# Patient Record
Sex: Male | Born: 1985 | Race: Black or African American | Hispanic: No | Marital: Single | Smoking: Former smoker
Health system: Southern US, Community
[De-identification: ages and names within clinical notes are randomized; demographics above are authoritative.]

---

## 2004-07-19 ENCOUNTER — Emergency Department (HOSPITAL_COMMUNITY): Admission: EM | Admit: 2004-07-19 | Discharge: 2004-07-19 | Payer: Self-pay | Admitting: Emergency Medicine

## 2004-12-25 ENCOUNTER — Emergency Department (HOSPITAL_COMMUNITY): Admission: EM | Admit: 2004-12-25 | Discharge: 2004-12-25 | Payer: Self-pay | Admitting: Emergency Medicine

## 2004-12-25 ENCOUNTER — Ambulatory Visit: Payer: Self-pay | Admitting: Orthopedic Surgery

## 2004-12-28 ENCOUNTER — Ambulatory Visit: Payer: Self-pay | Admitting: Orthopedic Surgery

## 2009-06-08 ENCOUNTER — Emergency Department (HOSPITAL_COMMUNITY): Admission: EM | Admit: 2009-06-08 | Discharge: 2009-06-08 | Payer: Self-pay | Admitting: Emergency Medicine

## 2009-06-10 ENCOUNTER — Emergency Department (HOSPITAL_COMMUNITY): Admission: EM | Admit: 2009-06-10 | Discharge: 2009-06-10 | Payer: Self-pay | Admitting: Pediatrics

## 2009-08-22 ENCOUNTER — Emergency Department (HOSPITAL_COMMUNITY): Admission: EM | Admit: 2009-08-22 | Discharge: 2009-08-22 | Payer: Self-pay | Admitting: Emergency Medicine

## 2009-09-28 ENCOUNTER — Emergency Department (HOSPITAL_COMMUNITY): Admission: EM | Admit: 2009-09-28 | Discharge: 2009-09-28 | Payer: Self-pay | Admitting: Emergency Medicine

## 2011-04-05 LAB — CULTURE, ROUTINE-ABSCESS
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
## 2011-05-14 NOTE — Op Note (Signed)
NAME:  Javier Moss, Javier Moss            ACCOUNT NO.:  [id number]   MEDICAL RECORD NO.:  [id number]          PATIENT TYPE:  EMS   LOCATION:  ED                            FACILITY:  [facility]   PHYSICIAN:  Karol T. Lazarus Salines, M.D. DATE OF BIRTH:  [birth year]   DATE OF PROCEDURE:  DATE OF DISCHARGE:  07/19/2004                                 OPERATIVE REPORT   PREOPERATIVE DIAGNOSIS:  Recurrent laryngeal papillomatosis.   POSTOPERATIVE DIAGNOSIS:  Recurrent laryngeal papillomatosis.   OPERATION PERFORMED:  Microdirect laryngoscopy and mechanical debridement,  vocal cord papillomatosis.   SURGEON:  Gloris Manchester. Lazarus Salines, M.D.   ANESTHESIA:  General orotracheal anesthesia.   ESTIMATED BLOOD LOSS:  Minimal.   COMPLICATIONS:  None.   FINDINGS:  Relatively bulky pedunculated papillomatous disease on the  superior and inferior surfaces of both vocal cords wrapped around the  anterior commissure in a horseshoe fashion with the left vocal cord slightly  more involved than the right.  No significant extension supraglottic or  subglottic.   DESCRIPTION OF PROCEDURE:  With the patient in a comfortable supine  position, general orotracheal anesthesia was induced without difficulty.  At  an appropriate level, the table was turned 90 degrees and the patient placed  in reverse Trendelenburg.  A clean preparation and draping was accomplished.  The rubber tooth guard was placed.  Taking care to protect lips, teeth and  endotracheal tube, the laser laryngoscope was introduced, poised in the  glottis and suspended in the standard fashion.  The findings were as  described above.  4% cocaine solution was applied on 1/2 x 1-1/2 inch  cottonoids to both sides.  Several minutes were allowed for intraoperative  hemostasis to take place.  The cottonoids were removed and photographs were  taken.  Using the laryngeal microdebrider tip, the bulky papillomatous  disease was harvested from both sides, working along the  vocal ligament.  A  tiny amount of disease was left in the anterior commissure to prevent web  formation, otherwise relatively complete mechanical debridement was  accomplished with minimal oozing.  The trachea was suctioned free of a small  amount of blood above the endotracheal tube balloon.  4% cocaine moistened  pledgets were placed against the raw surfaces once again for intraoperative  hemostasis.  Again, several minutes were allowed for this to take effect.  Pledgets were removed and hemostasis was observed.  Photograph was taken but  the orientation was poor and the photographs were discarded.  Before the  photographs were completed, the laryngoscope was unsuspended and removed  without difficulty suctioning free small amounts of residual blood on the  way out.  Rubber tooth guard was removed and the dental status was intact.  The patient was returned to anesthesia, awakened, extubated and transferred  to recovery in stable condition.   COMMENT:  Reportedly, the 11th episode  of microdirect laryngoscopy and  removal of laryngeal papillomatosis for this 25 year old black male.  Anticipate a routine postoperative recovery with attention to analgesia as  needed, vocal hygiene.  Given low anticipated risks of post anesthetic or  post surgical complications,  I feel an outpatient venue is appropriate.      Karo   KTW/MEDQ  D:  09/28/2004  T:  09/28/2004  Job:  [id number]

## 2016-10-18 ENCOUNTER — Encounter (HOSPITAL_COMMUNITY): Payer: Self-pay | Admitting: Emergency Medicine

## 2016-10-18 ENCOUNTER — Emergency Department (HOSPITAL_COMMUNITY)
Admission: EM | Admit: 2016-10-18 | Discharge: 2016-10-18 | Disposition: A | Discharge status: Home / Self Care | Payer: Self-pay | Attending: Emergency Medicine | Admitting: Emergency Medicine

## 2016-10-18 DIAGNOSIS — Z202 Contact with and (suspected) exposure to infections with a predominantly sexual mode of transmission: Secondary | ICD-10-CM | POA: Insufficient documentation

## 2016-10-18 MED ORDER — METRONIDAZOLE 500 MG PO TABS: 500.0000 mg | ORAL_TABLET | Freq: Two times a day (BID) | ORAL | 0 refills | Status: DC

## 2016-10-18 NOTE — Discharge Instructions (Signed)
Return if any problems.
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
## 2016-10-18 NOTE — ED Provider Notes (Signed)
[facility]-EMERGENCY DEPT Provider Note   CSN: [id number] Arrival date & time: 10/18/16  1545  By signing my name below, I, Soijett Blue, attest that this documentation has been prepared under the direction and in the presence of Langston MaskerKaren Adanely Reynoso, PA-C Electronically Signed: Soijett Blue, ED Scribe. 10/18/16. 4:54 PM.   History   Chief Complaint Chief Complaint  Patient presents with  . Exposure to STD    HPI Javier Moss is a 30 y.o. male who presents to the Emergency Department complaining of exposure to STD onset today. Pt notes that he was informed by a male partner that she was dx with trichomonas and that he would need to be evaluated. He states that he has not tried any medications for the relief of his symptoms. He denies penile pain, penile swelling, penile discharge, testicular pain, testicular swelling, dysuria, fever, chills, and any other symptoms.   The history is provided by the patient. No language interpreter was used.    History reviewed. No pertinent past medical history.  There are no active problems to display for this patient.   History reviewed. No pertinent surgical history.     Home Medications    Prior to Admission medications   Not on File    Family History History reviewed. No pertinent family history.  Social History Social History  Substance Use Topics  . Smoking status: Never Smoker  . Smokeless tobacco: Not on file  . Alcohol use No     Allergies   Review of patient's allergies indicates no known allergies.   Review of Systems Review of Systems  Constitutional: Negative for chills and fever.  Genitourinary: Negative for discharge, dysuria, penile pain, penile swelling, scrotal swelling and testicular pain.     Physical Exam Updated Vital Signs BP 145/87 (BP Location: Left Arm)   Pulse 93   Temp 98.6 F (37 C) (Oral)   Resp 16   Ht 6' (1.829 m)   Wt 166 lb (75.3 kg)   SpO2 100%   BMI 22.51 kg/m   Physical Exam    Constitutional: He is oriented to person, place, and time. He appears well-developed and well-nourished. No distress.  HENT:  Head: Normocephalic and atraumatic.  Eyes: EOM are normal.  Neck: Neck supple.  Cardiovascular: Normal rate and regular rhythm.  Exam reveals no gallop and no friction rub.   No murmur heard. Pulmonary/Chest: Effort normal and breath sounds normal. No respiratory distress. He has no wheezes. He has no rales.  Abdominal: He exhibits no distension.  Musculoskeletal: Normal range of motion.  Neurological: He is alert and oriented to person, place, and time.  Skin: Skin is warm and dry.  Psychiatric: He has a normal mood and affect. His behavior is normal.  Nursing note and vitals reviewed.    ED Treatments / Results  DIAGNOSTIC STUDIES: Oxygen Saturation is 100% on RA, nl by my interpretation.    COORDINATION OF CARE: 4:53 PM Discussed treatment plan with pt at bedside which includes flagyl Rx, UA, and pt agreed to plan.   Labs (all labs ordered are listed, but only abnormal results are displayed) Labs Reviewed - No data to display  Procedures Procedures (including critical care time)  Medications Ordered in ED Medications - No data to display   Initial Impression / Assessment and Plan / ED Course  I have reviewed the triage vital signs and the nursing notes.  Pertinent labs that were available during my care of the patient were reviewed by  me and considered in my medical decision making (see chart for details).  Clinical Course    No outpatient prescriptions have been marked as taking for the 10/18/16 encounter [facility] Encounter).    Final Clinical Impressions(s) / ED Diagnoses   Final diagnoses:  Exposure to STD    New Prescriptions New Prescriptions   METRONIDAZOLE (FLAGYL) 500 MG TABLET    Take 1 tablet (500 mg total) by mouth 2 (two) times daily.     Lonia Skinner Harrison, PA-C 10/18/16 1656    Canary Brim Tegeler, MD 10/20/16  1352

## 2016-10-19 LAB — GC/CHLAMYDIA PROBE AMP (~~LOC~~) NOT AT ARMC
Chlamydia: NEGATIVE
Neisseria Gonorrhea: NEGATIVE

## 2017-06-15 ENCOUNTER — Emergency Department (HOSPITAL_COMMUNITY): Payer: Self-pay

## 2017-06-15 ENCOUNTER — Emergency Department (HOSPITAL_COMMUNITY)
Admission: EM | Admit: 2017-06-15 | Discharge: 2017-06-15 | Disposition: A | Discharge status: Home / Self Care | Payer: Self-pay | Attending: Emergency Medicine | Admitting: Emergency Medicine

## 2017-06-15 ENCOUNTER — Encounter (HOSPITAL_COMMUNITY): Payer: Self-pay | Admitting: Cardiology

## 2017-06-15 DIAGNOSIS — Z79899 Other long term (current) drug therapy: Secondary | ICD-10-CM | POA: Insufficient documentation

## 2017-06-15 DIAGNOSIS — K625 Hemorrhage of anus and rectum: Secondary | ICD-10-CM | POA: Insufficient documentation

## 2017-06-15 DIAGNOSIS — K5900 Constipation, unspecified: Secondary | ICD-10-CM | POA: Insufficient documentation

## 2017-06-15 DIAGNOSIS — R030 Elevated blood-pressure reading, without diagnosis of hypertension: Secondary | ICD-10-CM | POA: Insufficient documentation

## 2017-06-15 LAB — CBC WITH DIFFERENTIAL/PLATELET
Basophils Absolute: 0 10*3/uL (ref 0.0–0.1)
Basophils Relative: 0 %
Eosinophils Absolute: 0.1 10*3/uL (ref 0.0–0.7)
Eosinophils Relative: 1 %
HCT: 45.6 % (ref 39.0–52.0)
Hemoglobin: 15.6 g/dL (ref 13.0–17.0)
LYMPHS ABS: 2.1 10*3/uL (ref 0.7–4.0)
LYMPHS PCT: 37 %
MCH: 28.8 pg (ref 26.0–34.0)
MCHC: 34.2 g/dL (ref 30.0–36.0)
MCV: 84.3 fL (ref 78.0–100.0)
Monocytes Absolute: 0.4 10*3/uL (ref 0.1–1.0)
Monocytes Relative: 7 %
NEUTROS ABS: 3.1 10*3/uL (ref 1.7–7.7)
Neutrophils Relative %: 55 %
Platelets: 127 10*3/uL — ABNORMAL LOW (ref 150–400)
RBC: 5.41 MIL/uL (ref 4.22–5.81)
RDW: 13.4 % (ref 11.5–15.5)
WBC: 5.7 10*3/uL (ref 4.0–10.5)

## 2017-06-15 LAB — BASIC METABOLIC PANEL
Anion gap: 6 (ref 5–15)
BUN: 17 mg/dL (ref 6–20)
CO2: 30 mmol/L (ref 22–32)
Calcium: 9.3 mg/dL (ref 8.9–10.3)
Chloride: 101 mmol/L (ref 101–111)
Creatinine, Ser: 1.25 mg/dL — ABNORMAL HIGH (ref 0.61–1.24)
GFR calc Af Amer: >60 mL/min (ref >60)
GFR calc non Af Amer: >60 mL/min (ref >60)
GLUCOSE: 97 mg/dL (ref 65–99)
Potassium: 4.2 mmol/L (ref 3.5–5.1)
SODIUM: 137 mmol/L (ref 135–145)

## 2017-06-15 LAB — POC OCCULT BLOOD, ED: Fecal Occult Bld: NEGATIVE

## 2017-06-15 MED ORDER — HYDROCORTISONE ACETATE 25 MG RE SUPP: 25.0000 mg | Freq: Two times a day (BID) | RECTAL | 0 refills | Status: DC

## 2017-06-15 MED ORDER — IOPAMIDOL (ISOVUE-300) INJECTION 61%
100.0000 mL | Freq: Once | INTRAVENOUS | Status: AC | PRN
  Administered 2017-06-15: 100 mL via INTRAVENOUS

## 2017-06-15 NOTE — ED Triage Notes (Signed)
Noticing blood in stool times one month.

## 2017-06-15 NOTE — Discharge Instructions (Signed)
Your blood count is normal. No evidence for severe internal bleeding or infection. Your exam suggest a possible small internal hemorrhoid. Your CT scan is negative for mass or abscess. Please increase water and juices and gatorade. Increase leafy green veggies and bran in your diet. Use anusol morning and evening for the possible hemorrhoid and the bleeding. If bleeding continues, please see Dr. Karilyn Cotaehman for GI evaluation of the bleeding.

## 2017-06-15 NOTE — ED Provider Notes (Signed)
[facility] Provider Note   CSN: [id number] Arrival date & time: 06/15/17  0804     History   Chief Complaint Chief Complaint  Patient presents with  . Rectal Bleeding    HPI Javier Moss is a 31 y.o. male.  Pt reports problem with constipation. He reports bleeding from rectal area with just sitting on the commode. He has tried rectal suppositories, but the problem continues to be present. No excess ASA or ibuprofen use.   The history is provided by the patient.  Rectal Bleeding  Quality:  Unable to specify Amount:  Moderate Duration:  1 month Timing:  Intermittent Chronicity:  New Context: constipation and rectal pain   Context: not anal penetration and not hemorrhoids   Pain details:    Quality:  Aching and throbbing   Severity:  Moderate   Duration:  1 month Similar prior episodes: no   Relieved by:  Nothing Worsened by:  Defecation Ineffective treatments:  Hemorrhoid cream Associated symptoms: no abdominal pain, no dizziness, no epistaxis, no fever, no light-headedness, no loss of consciousness, no recent illness and no vomiting   Risk factors: no anticoagulant use, no hx of colorectal cancer, no hx of colorectal surgery and no NSAID use     History reviewed. No pertinent past medical history.  There are no active problems to display for this patient.   History reviewed. No pertinent surgical history.     Home Medications    Prior to Admission medications   Medication Sig Start Date End Date Taking? Authorizing Provider  metroNIDAZOLE (FLAGYL) 500 MG tablet Take 1 tablet (500 mg total) by mouth 2 (two) times daily. 10/18/16   Elson Areas, PA-C    Family History History reviewed. No pertinent family history.  Social History Social History  Substance Use Topics  . Smoking status: Never Smoker  . Smokeless tobacco: Never Used  . Alcohol use No     Allergies   Patient has no known allergies.   Review of Systems Review of  Systems  Constitutional: Negative for activity change, appetite change and fever.  HENT: Negative for congestion, ear discharge, ear pain, facial swelling, nosebleeds, rhinorrhea, sneezing and tinnitus.   Eyes: Negative for photophobia, pain and discharge.  Respiratory: Negative for cough, choking, shortness of breath and wheezing.   Cardiovascular: Negative for chest pain, palpitations and leg swelling.  Gastrointestinal: Positive for blood in stool, hematochezia and rectal pain. Negative for abdominal pain, constipation, diarrhea, nausea and vomiting.  Genitourinary: Negative for difficulty urinating, dysuria, flank pain, frequency and hematuria.  Musculoskeletal: Negative for back pain, gait problem, myalgias and neck pain.  Skin: Negative for color change, rash and wound.  Neurological: Negative for dizziness, seizures, loss of consciousness, syncope, facial asymmetry, speech difficulty, weakness, light-headedness and numbness.  Hematological: Negative for adenopathy. Does not bruise/bleed easily.  Psychiatric/Behavioral: Negative for agitation, confusion, hallucinations, self-injury and suicidal ideas. The patient is not nervous/anxious.      Physical Exam Updated Vital Signs BP (!) 138/109 (BP Location: Right Arm)   Pulse 93   Temp 98.3 F (36.8 C) (Oral)   Resp 16   Ht 5\' 9"  (1.753 m)   Wt 77.1 kg (170 lb)   SpO2 98%   BMI 25.10 kg/m   Physical Exam  Constitutional: Vital signs are normal. He appears well-developed and well-nourished. He is active.  HENT:  Head: Normocephalic and atraumatic.  Right Ear: Tympanic membrane, external ear and ear canal normal.  Left Ear: Tympanic membrane, external  ear and ear canal normal.  Nose: Nose normal.  Mouth/Throat: Uvula is midline, oropharynx is clear and moist and mucous membranes are normal.  Eyes: Conjunctivae, EOM and lids are normal. Pupils are equal, round, and reactive to light.  Neck: Trachea normal, normal range of motion  and phonation normal. Neck supple. Carotid bruit is not present.  Cardiovascular: Normal rate, regular rhythm and normal pulses.   Abdominal: Soft. Normal appearance and bowel sounds are normal. He exhibits no distension and no mass. There is no splenomegaly or hepatomegaly. There is no tenderness. There is no rigidity, no guarding and no CVA tenderness.  Genitourinary: Rectal exam shows tenderness. Rectal exam shows no mass and guaiac negative stool.  Genitourinary Comments: There is no external rash or fistula. No external hemorrhoid. Question of small internal hemorrhoid.  Lymphadenopathy:       Head (right side): No submental, no preauricular and no posterior auricular adenopathy present.       Head (left side): No submental, no preauricular and no posterior auricular adenopathy present.    He has no cervical adenopathy.  Neurological: He is alert. He has normal strength. No cranial nerve deficit or sensory deficit. GCS eye subscore is 4. GCS verbal subscore is 5. GCS motor subscore is 6.  Skin: Skin is warm and dry.  Psychiatric: His speech is normal.     ED Treatments / Results  Labs (all labs ordered are listed, but only abnormal results are displayed) Labs Reviewed - No data to display  EKG  EKG Interpretation None       Radiology No results found.  Procedures Procedures (including critical care time)  Medications Ordered in ED Medications - No data to display   Initial Impression / Assessment and Plan / ED Course  I have reviewed the triage vital signs and the nursing notes.  Pertinent labs & imaging results that were available during my care of the patient were reviewed by me and considered in my medical decision making (see chart for details).       Final Clinical Impressions(s) / ED Diagnoses MDM Blood pressure elevated, but otherwise v/s wnl. Complete blood count is well within normal limits. No orthostatic changes on pulse and blood pressure checks.  Basic metabolic panel is within normal limits with exception of the creatinine being elevated at 1.25. Stool for occult blood is negative. CT scan of the abdomen and pelvis is negative for any acute mass, abscess, or other acute abnormalities.   There is questionable small internal hemorrhoid noted on the rectal exam. Question if this combined with the problems with constipation may be the source of bleeding. I've asked the patient to increase fluids, increase bran, and to monitor his bowel movements on a closely. Given the patient the name of the gastroenterologist on call. The patient is to make an appointment if this bleeding continues.    Final diagnoses:  Rectal bleeding  Constipation, unspecified constipation type    New Prescriptions Discharge Medication List as of 06/15/2017 10:33 AM    START taking these medications   Details  hydrocortisone (ANUSOL-HC) 25 MG suppository Place 1 suppository (25 mg total) rectally 2 (two) times daily., Starting Wed 06/15/2017, Print         Ivery QualeBryant, Jabori Henegar, PA-C 06/16/17 [id number]    Pricilla LovelessGoldston, Scott, MD 06/20/17 [phone]

## 2017-12-01 IMAGING — CT CT ABD-PELV W/ CM
2 of 4 series · 16 of 46 positions shown, 18 images · IV contrast (Isovue)
Comparison: None.

CLINICAL DATA: Blood in stool x1 month, rectal bleeding

EXAM:
CT ABDOMEN AND PELVIS WITH CONTRAST
TECHNIQUE: Multidetector CT imaging of the abdomen and pelvis was performed
using the standard protocol following bolus administration of
intravenous contrast.
CONTRAST:  100mL Z1DCR5-B00 IOPAMIDOL (Z1DCR5-B00) INJECTION 61%

[Series 2: axial st · axial · 0.63mm/px · z∈[-521,-151]mm · 13 of 82 slices shown, 15 images]
[im 4/82  soft-tissue]
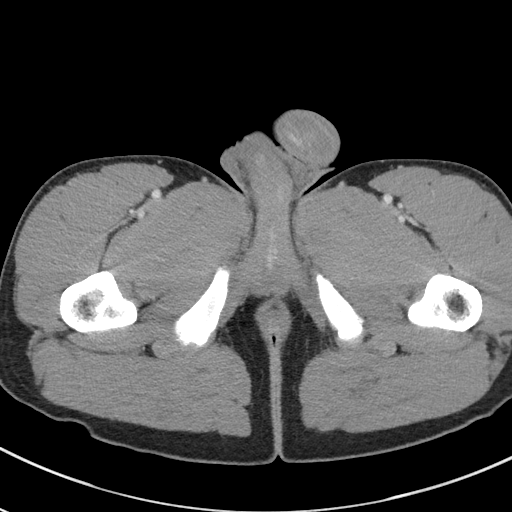
[im 4/82  bone]
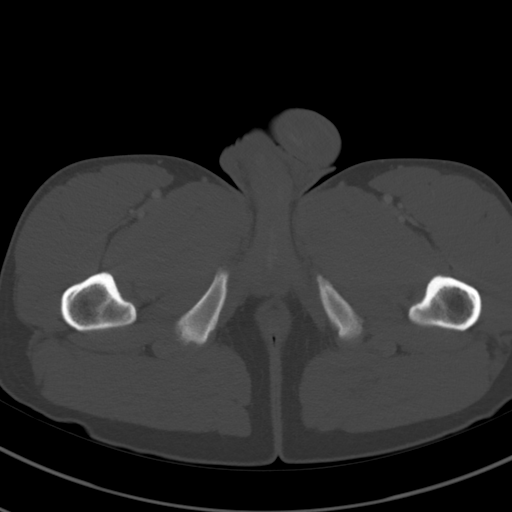
[im 10/82  soft-tissue]
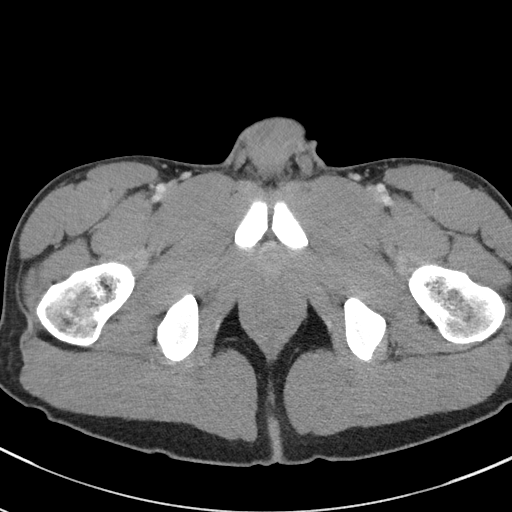
[im 17/82  soft-tissue]
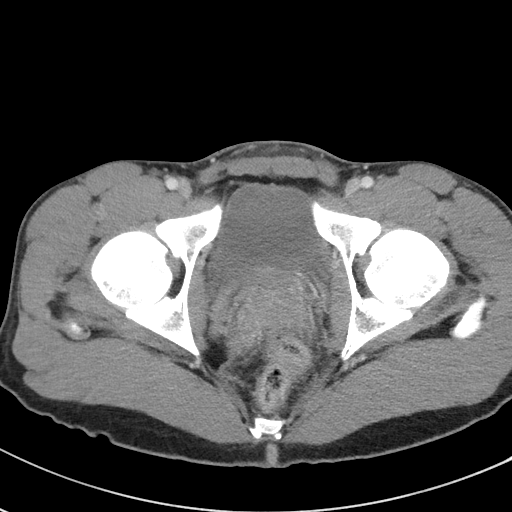
[im 23/82  soft-tissue]
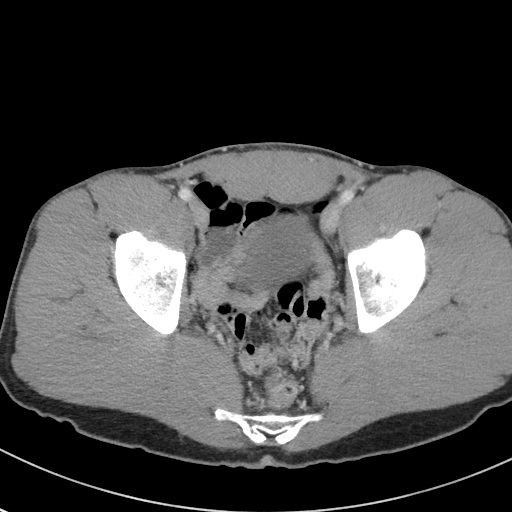
[im 30/82  soft-tissue]
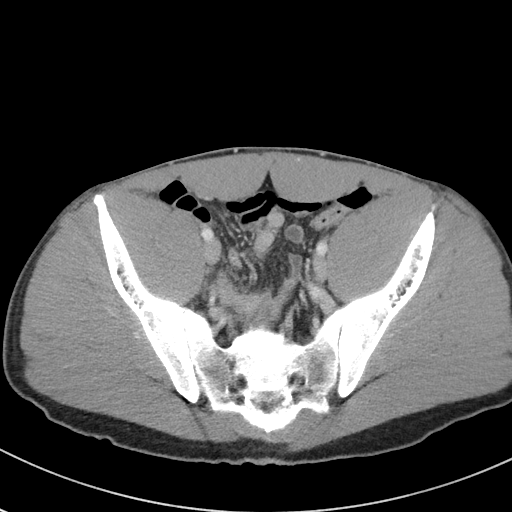
[im 36/82  soft-tissue]
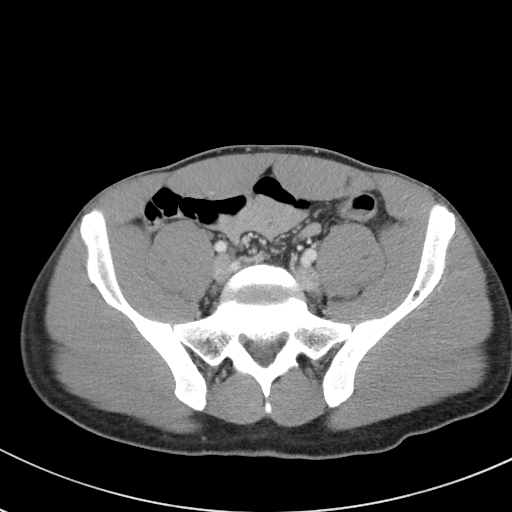
[im 43/82  soft-tissue]
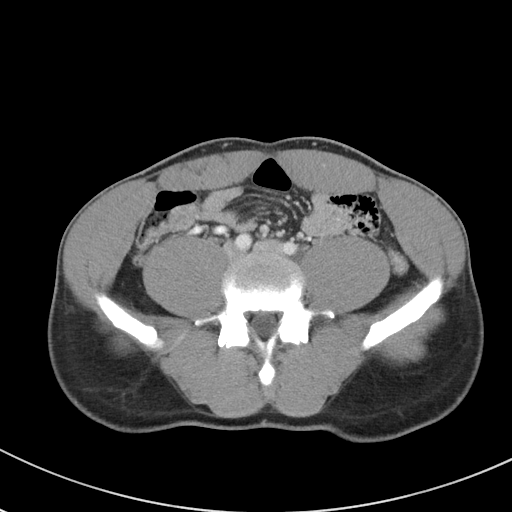
[im 46/82  soft-tissue]
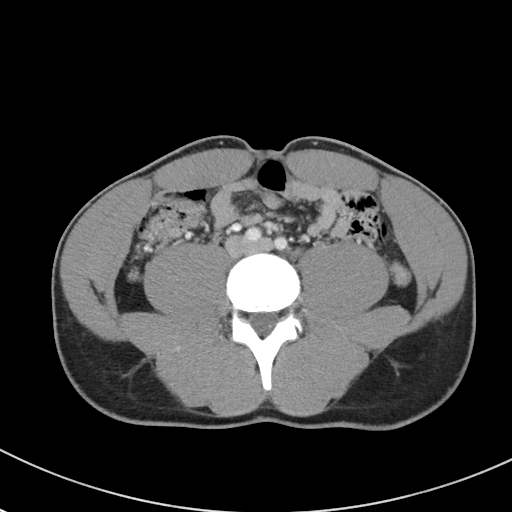
[im 52/82  soft-tissue]
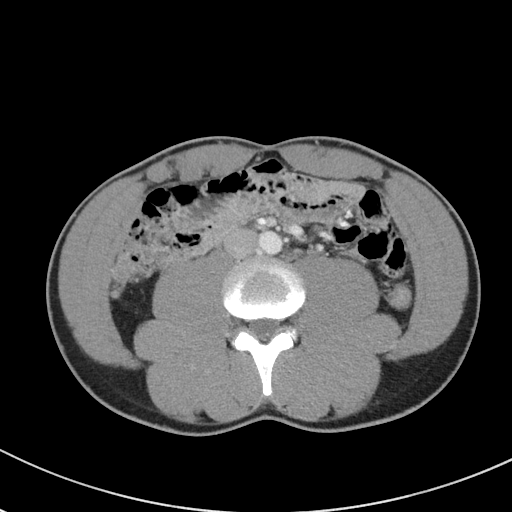
[im 52/82  bone]
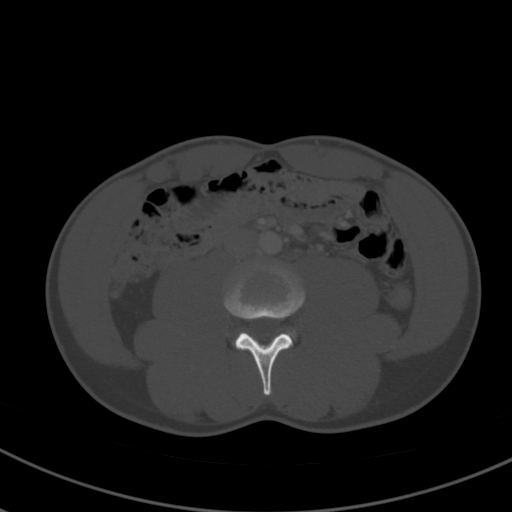
[im 59/82  soft-tissue]
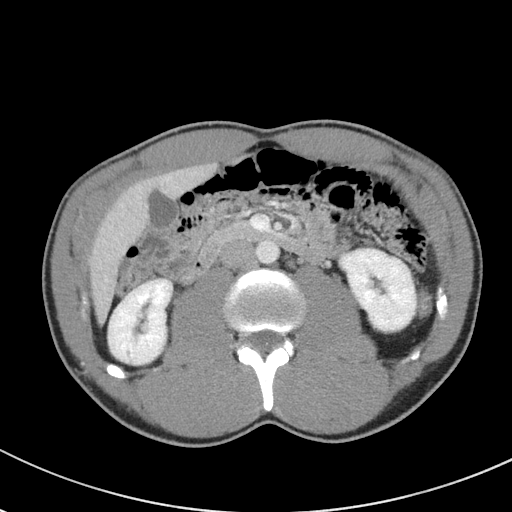
[im 65/82  soft-tissue]
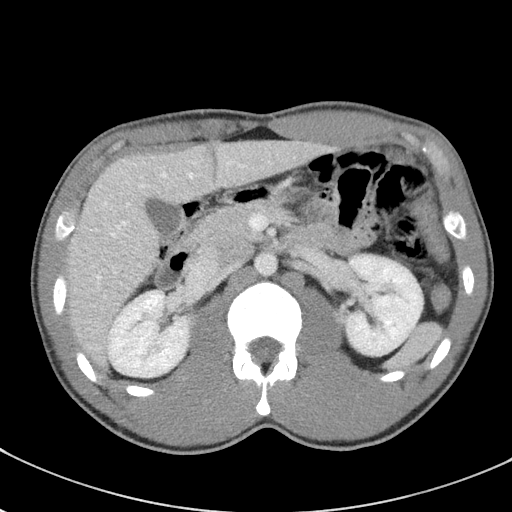
[im 72/82  soft-tissue]
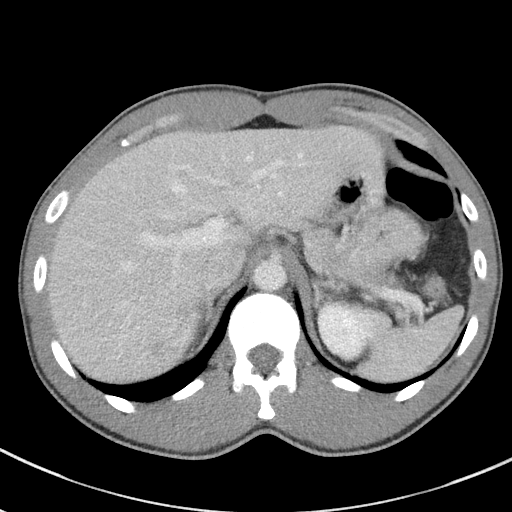
[im 78/82  soft-tissue]
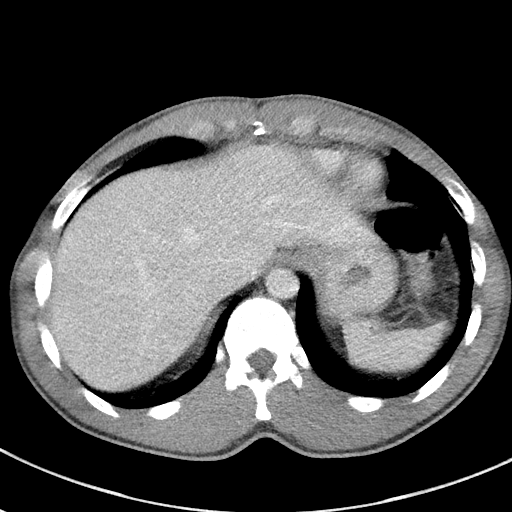

[Series 5: coronal st · coronal · 0.64mm/px · 3 of 79 slices shown]
[im 27/79  soft-tissue]
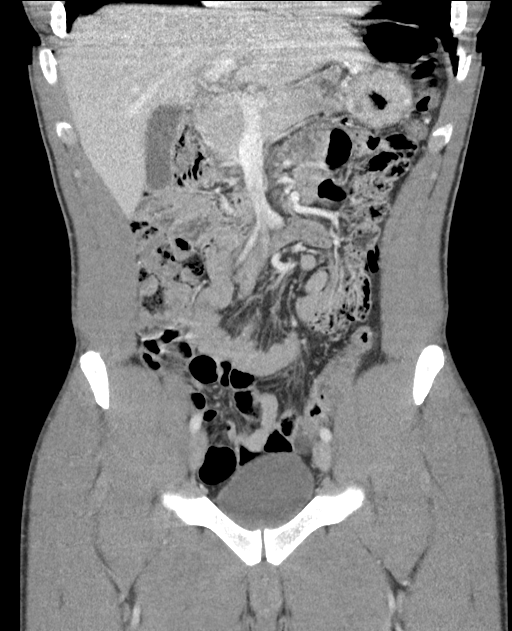
[im 35/79  soft-tissue]
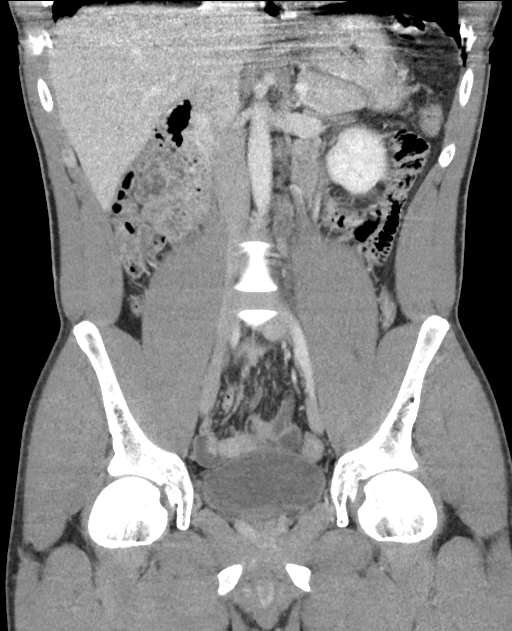
[im 44/79  soft-tissue]
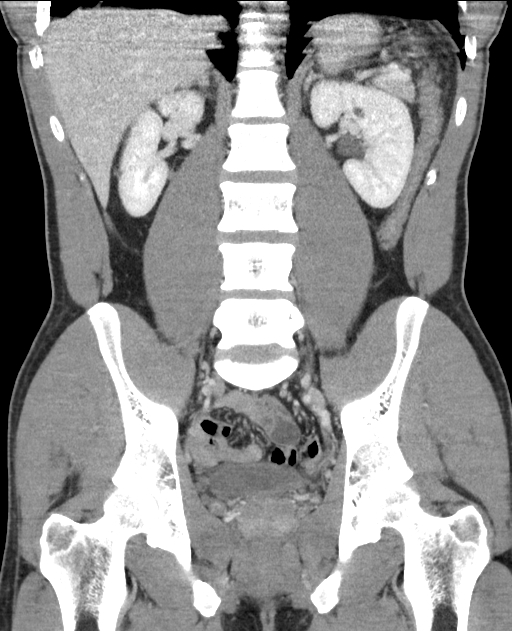

[16 of 46 positions shown; findings below may reference images not displayed]

FINDINGS: Motion degraded images.

Lower chest: Lung bases are clear.

Hepatobiliary: Liver is within normal limits.

Gallbladder is unremarkable. No intrahepatic or extrahepatic ductal
dilatation.

Pancreas: Within normal limits.

Spleen: Within normal limits.

Adrenals/Urinary Tract: Adrenal glands are within normal limits.

Kidneys within normal limits.  No hydronephrosis.

Bladder is within normal limits.

Stomach/Bowel: Stomach is within normal limits.

No evidence of bowel obstruction.

Normal appendix (series 2/ image 36).

Left colon is decompressed but unremarkable.

Vascular/Lymphatic: No evidence of abdominal aortic aneurysm.

No suspicious abdominopelvic lymphadenopathy.

Reproductive: Prostate is unremarkable.

Other: No abdominopelvic ascites.

Musculoskeletal: Visualized osseous structures are within normal
limits.
IMPRESSION: Motion degraded images.

Negative CT abdomen/pelvis.

## 2019-07-26 ENCOUNTER — Other Ambulatory Visit: Payer: Self-pay

## 2019-07-26 ENCOUNTER — Encounter (HOSPITAL_COMMUNITY): Payer: Self-pay | Admitting: Emergency Medicine

## 2019-07-26 ENCOUNTER — Emergency Department (HOSPITAL_COMMUNITY)
Admission: EM | Admit: 2019-07-26 | Discharge: 2019-07-26 | Disposition: A | Discharge status: Home / Self Care | Payer: Self-pay | Attending: Emergency Medicine | Admitting: Emergency Medicine

## 2019-07-26 DIAGNOSIS — Z87891 Personal history of nicotine dependence: Secondary | ICD-10-CM | POA: Insufficient documentation

## 2019-07-26 DIAGNOSIS — Z79899 Other long term (current) drug therapy: Secondary | ICD-10-CM | POA: Insufficient documentation

## 2019-07-26 DIAGNOSIS — M6282 Rhabdomyolysis: Secondary | ICD-10-CM | POA: Insufficient documentation

## 2019-07-26 DIAGNOSIS — E860 Dehydration: Secondary | ICD-10-CM | POA: Insufficient documentation

## 2019-07-26 LAB — URINALYSIS, ROUTINE W REFLEX MICROSCOPIC
Bilirubin Urine: NEGATIVE
Glucose, UA: NEGATIVE mg/dL
Hgb urine dipstick: NEGATIVE
Ketones, ur: 5 mg/dL — AB
Leukocytes,Ua: NEGATIVE
Nitrite: NEGATIVE
Protein, ur: 100 mg/dL — AB
Specific Gravity, Urine: 1.031 — ABNORMAL HIGH (ref 1.005–1.030)
pH: 5 (ref 5.0–8.0)

## 2019-07-26 LAB — COMPREHENSIVE METABOLIC PANEL
ALT: 22 U/L (ref 0–44)
AST: 30 U/L (ref 15–41)
Albumin: 5.5 g/dL — ABNORMAL HIGH (ref 3.5–5.0)
Alkaline Phosphatase: 82 U/L (ref 38–126)
Anion gap: 16 — ABNORMAL HIGH (ref 5–15)
BUN: 33 mg/dL — ABNORMAL HIGH (ref 6–20)
CO2: 20 mmol/L — ABNORMAL LOW (ref 22–32)
Calcium: 10.3 mg/dL (ref 8.9–10.3)
Chloride: 100 mmol/L (ref 98–111)
Creatinine, Ser: 2.33 mg/dL — ABNORMAL HIGH (ref 0.61–1.24)
GFR calc Af Amer: 41 mL/min — ABNORMAL LOW (ref >60)
GFR calc non Af Amer: 35 mL/min — ABNORMAL LOW (ref >60)
Glucose, Bld: 145 mg/dL — ABNORMAL HIGH (ref 70–99)
Potassium: 3.9 mmol/L (ref 3.5–5.1)
Sodium: 136 mmol/L (ref 135–145)
Total Bilirubin: 1 mg/dL (ref 0.3–1.2)
Total Protein: 9.7 g/dL — ABNORMAL HIGH (ref 6.5–8.1)

## 2019-07-26 LAB — BASIC METABOLIC PANEL
Anion gap: 9 (ref 5–15)
BUN: 31 mg/dL — ABNORMAL HIGH (ref 6–20)
CO2: 21 mmol/L — ABNORMAL LOW (ref 22–32)
Calcium: 7.9 mg/dL — ABNORMAL LOW (ref 8.9–10.3)
Chloride: 106 mmol/L (ref 98–111)
Creatinine, Ser: 1.8 mg/dL — ABNORMAL HIGH (ref 0.61–1.24)
GFR calc Af Amer: 56 mL/min — ABNORMAL LOW (ref >60)
GFR calc non Af Amer: 48 mL/min — ABNORMAL LOW (ref >60)
Glucose, Bld: 110 mg/dL — ABNORMAL HIGH (ref 70–99)
Potassium: 3.8 mmol/L (ref 3.5–5.1)
Sodium: 136 mmol/L (ref 135–145)

## 2019-07-26 LAB — CBC WITH DIFFERENTIAL/PLATELET
Abs Immature Granulocytes: 0.12 10*3/uL — ABNORMAL HIGH (ref 0.00–0.07)
Basophils Absolute: 0.1 10*3/uL (ref 0.0–0.1)
Basophils Relative: 1 %
Eosinophils Absolute: 0 10*3/uL (ref 0.0–0.5)
Eosinophils Relative: 0 %
HCT: 52.6 % — ABNORMAL HIGH (ref 39.0–52.0)
Hemoglobin: 17.8 g/dL — ABNORMAL HIGH (ref 13.0–17.0)
Immature Granulocytes: 1 %
Lymphocytes Relative: 9 %
Lymphs Abs: 1.4 10*3/uL (ref 0.7–4.0)
MCH: 27.6 pg (ref 26.0–34.0)
MCHC: 33.8 g/dL (ref 30.0–36.0)
MCV: 81.4 fL (ref 80.0–100.0)
Monocytes Absolute: 0.8 10*3/uL (ref 0.1–1.0)
Monocytes Relative: 5 %
Neutro Abs: 12.9 10*3/uL — ABNORMAL HIGH (ref 1.7–7.7)
Neutrophils Relative %: 84 %
Platelets: 167 10*3/uL (ref 150–400)
RBC: 6.46 MIL/uL — ABNORMAL HIGH (ref 4.22–5.81)
RDW: 13.3 % (ref 11.5–15.5)
WBC: 15.2 10*3/uL — ABNORMAL HIGH (ref 4.0–10.5)
nRBC: 0 % (ref 0.0–0.2)

## 2019-07-26 LAB — CK
Total CK: 844 U/L — ABNORMAL HIGH (ref 49–397)
Total CK: 912 U/L — ABNORMAL HIGH (ref 49–397)

## 2019-07-26 LAB — LIPASE, BLOOD: Lipase: 24 U/L (ref 11–51)

## 2019-07-26 MED ORDER — ONDANSETRON HCL 4 MG/2ML IJ SOLN
4.0000 mg | Freq: Once | INTRAMUSCULAR | Status: AC
  Administered 2019-07-26: 03:00:00 4 mg via INTRAVENOUS
  Filled 2019-07-26: qty 2

## 2019-07-26 MED ORDER — SODIUM CHLORIDE 0.9 % IV BOLUS
1000.0000 mL | Freq: Once | INTRAVENOUS | Status: AC
  Administered 2019-07-26: 1000 mL via INTRAVENOUS

## 2019-07-26 MED ORDER — ONDANSETRON 4 MG PO TBDP: 4.0000 mg | ORAL_TABLET | Freq: Three times a day (TID) | ORAL | 0 refills | Status: DC | PRN

## 2019-07-26 MED ORDER — SODIUM CHLORIDE 0.9 % IV BOLUS
1000.0000 mL | Freq: Once | INTRAVENOUS | Status: AC
Start: 2019-07-26 — End: 2019-07-26
  Administered 2019-07-26: 03:00:00 1000 mL via INTRAVENOUS

## 2019-07-26 MED ORDER — SODIUM CHLORIDE 0.9 % IV BOLUS
1000.0000 mL | Freq: Once | INTRAVENOUS | Status: AC
  Administered 2019-07-26: 03:00:00 1000 mL via INTRAVENOUS

## 2019-07-26 MED ORDER — KETOROLAC TROMETHAMINE 30 MG/ML IJ SOLN
30.0000 mg | Freq: Once | INTRAMUSCULAR | Status: AC
  Administered 2019-07-26: 30 mg via INTRAVENOUS
  Filled 2019-07-26: qty 1

## 2019-07-26 NOTE — ED Triage Notes (Signed)
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

## 2019-07-26 NOTE — ED Provider Notes (Signed)
[facility]ERGENCY DEPARTMENT Provider Note   [id number] [facility]: 07/[facility]/19  0044     History   Chief Complaint Chief Complaint  Patient presents with  . Emesis    HPI Leanord AsalDonald Moss is a 33 y.o. male.     Patient presents with generalized weakness, abdominal pain, nausea, vomiting and generalized weakness and decreased appetite.  Believes he is overheated working a new job at [employer].  He normally works 3 PM to 2 AM.  Started feeling bad today while at work around 5 PM.  He has been on this job only 3 days and quit today after feeling bad. Reports feeling nauseated and lightheaded with multiple associated vomiting is been nonbilious and nonbloody.  There was no diarrhea.  Has abdominal cramping as well as cramping in his arms and legs.  States he is not been able to keep anything down.  No diarrhea or fever.  No pain with urination or blood in the urine.  Denies any alcohol or drug use.  No other medical problems.  No headache or dizziness.  No chest pain or shortness of breath.  No focal weakness, numbness or tingling.  Denies any other medical problems or any other medication use.  The history is provided by the patient.  Emesis Associated symptoms: abdominal pain, arthralgias and myalgias   Associated symptoms: no cough, no diarrhea, no fever and no headaches     History reviewed. No pertinent past medical history.  There are no active problems to display for this patient.   History reviewed. No pertinent surgical history.      Home Medications    Prior to Admission medications   Medication Sig Start Date End Date Taking? Authorizing Provider  hydrocortisone (ANUSOL-HC) 25 MG suppository Place 1 suppository (25 mg total) rectally 2 (two) times daily. 06/15/17   Ivery QualeBryant, Hobson, PA-C  metroNIDAZOLE (FLAGYL) 500 MG tablet Take 1 tablet (500 mg total) by mouth 2 (two) times daily. Patient not taking: Reported on 06/15/2017 10/18/16   Osie CheeksSofia, Leslie K,  PA-C    Family History History reviewed. No pertinent family history.  Social History Social History   Tobacco Use  . Smoking status: Former Games developermoker  . Smokeless tobacco: Never Used  Substance Use Topics  . Alcohol use: No  . Drug use: Not Currently    Types: Marijuana    Comment: 2 days ago      Allergies   Patient has no known allergies.   Review of Systems Review of Systems  Constitutional: Positive for activity change and appetite change. Negative for fatigue and fever.  HENT: Negative for congestion and rhinorrhea.   Eyes: Negative for visual disturbance.  Respiratory: Negative for cough, chest tightness and shortness of breath.   Cardiovascular: Negative for chest pain and leg swelling.  Gastrointestinal: Positive for abdominal pain, nausea and vomiting. Negative for diarrhea.  Genitourinary: Negative for dysuria and hematuria.  Musculoskeletal: Positive for arthralgias and myalgias.  Skin: Negative for rash.  Neurological: Positive for weakness. Negative for dizziness, light-headedness and headaches.    all other systems are negative except as noted in the HPI and PMH.    Physical Exam Updated Vital Signs BP (!) 148/97 (BP Location: Right Arm)   Pulse 95   Temp 98.2 F (36.8 C) (Oral)   Resp 17   Ht 5\' 10"  (1.778 m)   Wt 81.6 kg   SpO2 100%   BMI 25.83 kg/m   Physical Exam Vitals signs and  nursing note reviewed.  Constitutional:      General: He is not in acute distress.    Appearance: He is well-developed.  HENT:     Head: Normocephalic and atraumatic.     Mouth/Throat:     Mouth: Mucous membranes are dry.     Pharynx: No oropharyngeal exudate.  Eyes:     Conjunctiva/sclera: Conjunctivae normal.     Pupils: Pupils are equal, round, and reactive to light.  Neck:     Musculoskeletal: Normal range of motion and neck supple.     Comments: No meningismus. Cardiovascular:     Rate and Rhythm: Normal rate and regular rhythm.     Heart sounds:  Normal heart sounds. No murmur.  Pulmonary:     Effort: Pulmonary effort is normal. No respiratory distress.     Breath sounds: Normal breath sounds.  Chest:     Chest wall: No tenderness.  Abdominal:     Palpations: Abdomen is soft.     Tenderness: There is no abdominal tenderness. There is no guarding or rebound.  Musculoskeletal: Normal range of motion.        General: No tenderness.     Comments: No clonus  Skin:    General: Skin is warm.     Capillary Refill: Capillary refill takes less than 2 seconds.  Neurological:     General: No focal deficit present.     Mental Status: He is alert and oriented to person, place, and time. Mental status is at baseline.     Cranial Nerves: No cranial nerve deficit.     Motor: No abnormal muscle tone.     Coordination: Coordination normal.     Comments: No ataxia on finger to nose bilaterally. No pronator drift. 5/5 strength throughout. CN 2-12 intact.Equal grip strength. Sensation intact.   Psychiatric:        Behavior: Behavior normal.      ED Treatments / Results  Labs (all labs ordered are listed, but only abnormal results are displayed) Labs Reviewed  CBC WITH DIFFERENTIAL/PLATELET - Abnormal; Notable for the following components:      Result Value   WBC 15.2 (*)    RBC 6.46 (*)    Hemoglobin 17.8 (*)    HCT 52.6 (*)    Neutro Abs 12.9 (*)    Abs Immature Granulocytes 0.12 (*)    All other components within normal limits  COMPREHENSIVE METABOLIC PANEL - Abnormal; Notable for the following components:   CO2 20 (*)    Glucose, Bld 145 (*)    BUN 33 (*)    Creatinine, Ser 2.33 (*)    Total Protein 9.7 (*)    Albumin 5.5 (*)    GFR calc non Af Amer 35 (*)    GFR calc Af Amer 41 (*)    Anion gap 16 (*)    All other components within normal limits  CK - Abnormal; Notable for the following components:   Total CK 844 (*)    All other components within normal limits  URINALYSIS, ROUTINE W REFLEX MICROSCOPIC - Abnormal; Notable  for the following components:   Color, Urine AMBER (*)    APPearance CLOUDY (*)    Specific Gravity, Urine 1.031 (*)    Ketones, ur 5 (*)    Protein, ur 100 (*)    Bacteria, UA RARE (*)    All other components within normal limits  CK - Abnormal; Notable for the following components:   Total CK 912 (*)  All other components within normal limits  BASIC METABOLIC PANEL - Abnormal; Notable for the following components:   CO2 21 (*)    Glucose, Bld 110 (*)    BUN 31 (*)    Creatinine, Ser 1.80 (*)    Calcium 7.9 (*)    GFR calc non Af Amer 48 (*)    GFR calc Af Amer 56 (*)    All other components within normal limits  LIPASE, BLOOD    EKG None  Radiology No results found.  Procedures Procedures (including critical care time)  Medications Ordered in ED Medications  sodium chloride 0.9 % bolus 1,000 mL (has no administration in time range)  ondansetron (ZOFRAN) injection 4 mg (has no administration in time range)  sodium chloride 0.9 % bolus 1,000 mL (has no administration in time range)     Initial Impression / Assessment and Plan / ED Course  I have reviewed the triage vital signs and the nursing notes.  Pertinent labs & imaging results that were available during my care of the patient were reviewed by me and considered in my medical decision making (see chart for details).       Patient with abdominal pain, nausea, vomiting, body aches and cramps after working in the heat today.  Vitals stable.  No distress.  Abdomen soft without peritoneal signs.  He will be hydrated.  Labs show hemoconcentration with AKI of 2.3.  CK 844.  Patient feeling better after receiving IV fluids.  His abdomen is soft and nontender and is tolerating p.o.  Patient feels much improved.  He is tolerating p.o. and cramps have resolved.  Abdomen is soft and nontender.  Creatinine has improved to 1.8.  CK 912.  Discussed diagnosis of rhabdomyolysis with the patient.  He is anxious to leave.   Discussed increasing his oral hydration at home and avoiding hot environments.  Advised follow-up with PCP for recheck of creatinine and CK later this week.  Return precautions discussed.  Final Clinical Impressions(s) / ED Diagnoses   Final diagnoses:  Dehydration  Non-traumatic rhabdomyolysis    ED Discharge Orders    None       Ariella Voit, Annie Main, MD 07/26/19 [phone]

## 2019-07-26 NOTE — Discharge Instructions (Signed)
Keep yourself hydrated.  Avoid hot environments.  As we discussed you should have your kidney function rechecked Friday or Monday.  Establish care with a primary doctor or go to urgent care to have this blood test done.  Come back to the ED if you not able to go anywhere else to have a blood test to check your kidney function.  Keep yourself hydrated at home and avoid hot environments, alcohol, caffeine.  Return to the ED if you develop new or worsening symptoms.

## 2020-05-06 ENCOUNTER — Ambulatory Visit: Payer: Self-pay | Attending: Internal Medicine

## 2020-05-06 ENCOUNTER — Other Ambulatory Visit: Payer: Self-pay

## 2020-05-06 DIAGNOSIS — Z20822 Contact with and (suspected) exposure to covid-19: Secondary | ICD-10-CM

## 2020-05-07 LAB — NOVEL CORONAVIRUS, NAA: SARS-CoV-2, NAA: NOT DETECTED

## 2020-05-07 LAB — SARS-COV-2, NAA 2 DAY TAT

## 2020-08-13 ENCOUNTER — Other Ambulatory Visit: Payer: Self-pay

## 2021-01-15 ENCOUNTER — Other Ambulatory Visit: Payer: Self-pay

## 2021-01-15 DIAGNOSIS — Z20822 Contact with and (suspected) exposure to covid-19: Secondary | ICD-10-CM

## 2021-01-16 LAB — NOVEL CORONAVIRUS, NAA: SARS-CoV-2, NAA: DETECTED — AB

## 2021-01-16 LAB — SARS-COV-2, NAA 2 DAY TAT

## 2022-08-31 ENCOUNTER — Other Ambulatory Visit: Payer: Self-pay

## 2022-08-31 ENCOUNTER — Emergency Department (HOSPITAL_COMMUNITY)
Admission: EM | Admit: 2022-08-31 | Discharge: 2022-08-31 | Disposition: A | Discharge status: Home / Self Care | Payer: BC Managed Care – PPO | Attending: Emergency Medicine | Admitting: Emergency Medicine

## 2022-08-31 ENCOUNTER — Encounter (HOSPITAL_COMMUNITY): Payer: Self-pay | Admitting: *Deleted

## 2022-08-31 DIAGNOSIS — M5010 Cervical disc disorder with radiculopathy, unspecified cervical region: Secondary | ICD-10-CM | POA: Diagnosis not present

## 2022-08-31 DIAGNOSIS — M542 Cervicalgia: Secondary | ICD-10-CM | POA: Diagnosis present

## 2022-08-31 MED ORDER — HYDROCODONE-ACETAMINOPHEN 5-325 MG PO TABS: 1.0000 | ORAL_TABLET | ORAL | 0 refills | Status: DC | PRN

## 2022-08-31 MED ORDER — DEXAMETHASONE SODIUM PHOSPHATE 10 MG/ML IJ SOLN
10.0000 mg | Freq: Once | INTRAMUSCULAR | Status: AC
  Administered 2022-08-31: 10 mg via INTRAMUSCULAR
  Filled 2022-08-31: qty 1

## 2022-08-31 MED ORDER — PREDNISONE 50 MG PO TABS: 50.0000 mg | ORAL_TABLET | Freq: Every day | ORAL | 0 refills | Status: DC

## 2022-08-31 MED ORDER — METHOCARBAMOL 500 MG PO TABS: 500.0000 mg | ORAL_TABLET | Freq: Three times a day (TID) | ORAL | 0 refills | Status: DC | PRN

## 2022-08-31 MED ORDER — KETOROLAC TROMETHAMINE 30 MG/ML IJ SOLN
30.0000 mg | Freq: Once | INTRAMUSCULAR | Status: AC
  Administered 2022-08-31: 30 mg via INTRAMUSCULAR
  Filled 2022-08-31: qty 1

## 2022-08-31 MED ORDER — IBUPROFEN 600 MG PO TABS: 600.0000 mg | ORAL_TABLET | Freq: Four times a day (QID) | ORAL | 0 refills | Status: DC | PRN

## 2022-08-31 NOTE — ED Provider Notes (Signed)
[facility] EMERGENCY DEPARTMENT Provider Note   CSN: [id number] Arrival date & time: 08/31/22  0745     History  Chief Complaint  Patient presents with   Shoulder Pain    Javier Moss is a 36 y.o. male.  Pt is a 36 yo male with no significant pmhx.  He woke up this am around 0200 with pain in his neck radiating down into his arm and hand.  He has some numbness to his right 4th and 5th fingers.  He has no trouble moving his arm.  He has not hx of neck problems.  No known trauma.  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  He does a lot of repetitive work, but was off for the long weekend.  Pt took asa and ibuprofen which did not help.       Home Medications Prior to Admission medications   Medication Sig Start Date End Date Taking? Authorizing Provider  ibuprofen (ADVIL) 600 MG tablet Take 1 tablet (600 mg total) by mouth every 6 (six) hours as needed. 08/31/22  Yes Jacalyn Lefevre, MD  methocarbamol (ROBAXIN) 500 MG tablet Take 1 tablet (500 mg total) by mouth every 8 (eight) hours as needed for muscle spasms (neck pain). 08/31/22  Yes Jacalyn Lefevre, MD  predniSONE (DELTASONE) 50 MG tablet Take 1 tablet (50 mg total) by mouth daily with breakfast. 08/31/22  Yes Jacalyn Lefevre, MD  HYDROcodone-acetaminophen (NORCO/VICODIN) 5-325 MG tablet Take 1 tablet by mouth every 4 (four) hours as needed. 08/31/22   Jacalyn Lefevre, MD  hydrocortisone (ANUSOL-HC) 25 MG suppository Place 1 suppository (25 mg total) rectally 2 (two) times daily. 06/15/17   Ivery Quale, PA-C  metroNIDAZOLE (FLAGYL) 500 MG tablet Take 1 tablet (500 mg total) by mouth 2 (two) times daily. Patient not taking: Reported on 06/15/2017 10/18/16   Elson Areas, PA-C  ondansetron (ZOFRAN ODT) 4 MG disintegrating tablet Take 1 tablet (4 mg total) by mouth every 8 (eight) hours as needed for nausea or vomiting. 07/26/19   Glynn Octave, MD      Allergies    Patient has no known allergies.    Review of  Systems   Review of Systems  Musculoskeletal:  Positive for neck pain.       Right arm pain  All other systems reviewed and are negative.   Physical Exam Updated Vital Signs Temp 98.4 F (36.9 C) (Oral)   Ht 5\' 11"  (1.803 m)   Wt 79.4 kg   BMI 24.41 kg/m  Physical Exam Vitals and nursing note reviewed.  Constitutional:      Appearance: Normal appearance.  HENT:     Head: Normocephalic and atraumatic.     Right Ear: External ear normal.     Left Ear: External ear normal.     Nose: Nose normal.     Mouth/Throat:     Mouth: Mucous membranes are moist.     Pharynx: Oropharynx is clear.  Eyes:     Extraocular Movements: Extraocular movements intact.     Conjunctiva/sclera: Conjunctivae normal.     Pupils: Pupils are equal, round, and reactive to light.  Neck:   Cardiovascular:     Rate and Rhythm: Normal rate and regular rhythm.     Pulses: Normal pulses.     Heart sounds: Normal heart sounds.  Pulmonary:     Effort: Pulmonary effort is normal.     Breath sounds: Normal breath sounds.  Abdominal:  General: Abdomen is flat. Bowel sounds are normal.     Palpations: Abdomen is soft.  Musculoskeletal:        General: Normal range of motion.     Cervical back: Normal range of motion and neck supple.  Skin:    General: Skin is warm.     Capillary Refill: Capillary refill takes less than 2 seconds.  Neurological:     General: No focal deficit present.     Mental Status: He is alert and oriented to person, place, and time.  Psychiatric:        Mood and Affect: Mood normal.        Behavior: Behavior normal.     ED Results / Procedures / Treatments   Labs (all labs ordered are listed, but only abnormal results are displayed) Labs Reviewed - No data to display  EKG EKG Interpretation  Date/Time:  Tuesday August 31 2022 08:07:53 EDT Ventricular Rate:  62 PR Interval:  166 QRS Duration: 93 QT Interval:  377 QTC Calculation: 383 R Axis:   51 Text  Interpretation: Sinus rhythm No old tracing to compare Confirmed by Jacalyn Lefevre [phone]) on 08/31/2022 8:23:20 AM  Radiology No results found.  Procedures Procedures    Medications Ordered in ED Medications  dexamethasone (DECADRON) injection 10 mg (has no administration in time range)  ketorolac (TORADOL) 30 MG/ML injection 30 mg (has no administration in time range)    ED Course/ Medical Decision Making/ A&P                           Medical Decision Making Risk Prescription drug management.   Sx c/w cervical radiculopathy.  Pt given decadron and toradol in the ED.  He will be d/c with robaxin and prednisone.  He is to f/u with ns.  Return if worse.         Final Clinical Impression(s) / ED Diagnoses Final diagnoses:  Cervical disc disorder with radiculopathy of cervical region    Rx / DC Orders ED Discharge Orders          Ordered    methocarbamol (ROBAXIN) 500 MG tablet  Every 8 hours PRN        08/31/22 0829    predniSONE (DELTASONE) 50 MG tablet  Daily with breakfast        08/31/22 0829    ibuprofen (ADVIL) 600 MG tablet  Every 6 hours PRN        08/31/22 0829    HYDROcodone-acetaminophen (NORCO/VICODIN) 5-325 MG tablet  Every 4 hours PRN,   Status:  Discontinued        08/31/22 0829    HYDROcodone-acetaminophen (NORCO/VICODIN) 5-325 MG tablet  Every 4 hours PRN        08/31/22 0830              Jacalyn Lefevre, MD 08/31/22 0830

## 2022-08-31 NOTE — ED Triage Notes (Signed)
Tingling pain that starts from right side of neck down to right arm that started about 0200 this morning after waking him up. Pt denies any current injury and denies chest pain. Pt as taken 324mg  asa, 600mg  Ibuprofen. Pt denies medication to help pain.

## 2023-07-07 ENCOUNTER — Ambulatory Visit
Admission: EM | Admit: 2023-07-07 | Discharge: 2023-07-07 | Disposition: A | Discharge status: Home / Self Care | Payer: BC Managed Care – PPO | Attending: Nurse Practitioner | Admitting: Nurse Practitioner

## 2023-07-07 ENCOUNTER — Encounter: Payer: Self-pay | Admitting: Emergency Medicine

## 2023-07-07 DIAGNOSIS — Z8709 Personal history of other diseases of the respiratory system: Secondary | ICD-10-CM | POA: Insufficient documentation

## 2023-07-07 DIAGNOSIS — Z20822 Contact with and (suspected) exposure to covid-19: Secondary | ICD-10-CM | POA: Diagnosis not present

## 2023-07-07 MED ORDER — FLUTICASONE PROPIONATE 50 MCG/ACT NA SUSP: 2.0000 | Freq: Every day | NASAL | 0 refills | Status: AC

## 2023-07-07 MED ORDER — CETIRIZINE HCL 10 MG PO TABS: 10.0000 mg | ORAL_TABLET | Freq: Every day | ORAL | 0 refills | Status: AC

## 2023-07-07 NOTE — ED Triage Notes (Signed)
Exposed to covid from girlfriend.  C/o a runny nose x 2 days.

## 2023-07-07 NOTE — Discharge Instructions (Addendum)
COVID test is pending.  As discussed, you will be contacted if the pending test is positive to provide treatment. Take medication as prescribed. Increase fluids and allow for plenty of rest. Recommend normal saline nasal spray throughout the day to help with nasal congestion and runny nose. If your COVID test is positive, you will take the medication for 5 days.  Make sure you are wearing your mask during that time.  If you complete the medication and continue to experience symptoms, you will need to continue to wear your mask for an additional 5 days.  If you develop a fever, you will need to remain home isolated until you have been fever free for 24 hours with no medication. Go to the emergency department if you experience shortness of breath, difficulty breathing, or other concerns. Follow-up as needed.

## 2023-07-07 NOTE — ED Provider Notes (Signed)
[facility]    CSN: [id number] Arrival date & time: 07/07/23  1636      History   Chief Complaint No chief complaint on file.   HPI Javier Moss is a 37 y.o. male.   The history is provided by the patient.   The patient presents for complaints of COVID exposure.  Patient complains of runny nose that been present for the past 2 to 3 days.  He denies fever, chills, headache, sore throat, ear pain, cough, chest pain, abdominal pain, nausea, vomiting, or diarrhea.  Patient reports that his girlfriend tested positive for COVID 1 day ago.  He reports he does have an underlying history of seasonal allergies.  Reports that he takes Benadryl from time to time for that.  Patient reports he has not been vaccinated.  History reviewed. No pertinent past medical history.  There are no problems to display for this patient.   History reviewed. No pertinent surgical history.     Home Medications    Prior to Admission medications   Medication Sig Start Date End Date Taking? Authorizing Provider  cetirizine (ZYRTEC) 10 MG tablet Take 1 tablet (10 mg total) by mouth daily. 07/07/23  Yes Traye Bates-Warren, Sadie Haber, NP  fluticasone (FLONASE) 50 MCG/ACT nasal spray Place 2 sprays into both nostrils daily. 07/07/23  Yes Manmeet Arzola-Warren, Sadie Haber, NP  ibuprofen (ADVIL) 600 MG tablet Take 1 tablet (600 mg total) by mouth every 6 (six) hours as needed. 08/31/22   Jacalyn Lefevre, MD    Family History History reviewed. No pertinent family history.  Social History Social History   Tobacco Use   Smoking status: Former   Smokeless tobacco: Never  Advertising account planner   Vaping status: Never Used  Substance Use Topics   Alcohol use: No   Drug use: Not Currently    Types: Marijuana    Comment: 2 days ago      Allergies   Patient has no known allergies.   Review of Systems Review of Systems Per HPI  Physical Exam Triage Vital Signs ED Triage Vitals  Encounter Vitals Group     BP  07/07/23 1643 (!) 144/90     Systolic BP Percentile --      Diastolic BP Percentile --      Pulse Rate 07/07/23 1643 72     Resp 07/07/23 1643 18     Temp 07/07/23 1643 98.2 F (36.8 C)     Temp Source 07/07/23 1643 Oral     SpO2 07/07/23 1643 97 %     Weight --      Height --      Head Circumference --      Peak Flow --      Pain Score 07/07/23 1645 0     Pain Loc --      Pain Education --      Exclude from Growth Chart --    No data found.  Updated Vital Signs BP (!) 144/90 (BP Location: Right Arm)   Pulse 72   Temp 98.2 F (36.8 C) (Oral)   Resp 18   SpO2 97%   Visual Acuity Right Eye Distance:   Left Eye Distance:   Bilateral Distance:    Right Eye Near:   Left Eye Near:    Bilateral Near:     Physical Exam Vitals and nursing note reviewed.  Constitutional:      General: He is not in acute distress.    Appearance: Normal appearance.  HENT:  Head: Normocephalic.     Right Ear: Tympanic membrane, ear canal and external ear normal.     Left Ear: Tympanic membrane, ear canal and external ear normal.     Nose: Congestion present. No rhinorrhea.     Mouth/Throat:     Mouth: Mucous membranes are moist.     Pharynx: Posterior oropharyngeal erythema present.  Eyes:     Extraocular Movements: Extraocular movements intact.     Conjunctiva/sclera: Conjunctivae normal.     Pupils: Pupils are equal, round, and reactive to light.  Cardiovascular:     Rate and Rhythm: Normal rate and regular rhythm.     Pulses: Normal pulses.     Heart sounds: Normal heart sounds.  Pulmonary:     Effort: Pulmonary effort is normal. No respiratory distress.     Breath sounds: Normal breath sounds. No stridor. No wheezing, rhonchi or rales.  Abdominal:     General: Bowel sounds are normal.     Palpations: Abdomen is soft.  Musculoskeletal:     Cervical back: Normal range of motion.  Lymphadenopathy:     Cervical: No cervical adenopathy.  Skin:    General: Skin is warm and  dry.  Neurological:     General: No focal deficit present.     Mental Status: He is alert and oriented to person, place, and time.  Psychiatric:        Mood and Affect: Mood normal.        Behavior: Behavior normal.      UC Treatments / Results  Labs (all labs ordered are listed, but only abnormal results are displayed) Labs Reviewed  SARS CORONAVIRUS 2 (TAT 6-24 HRS)    EKG   Radiology No results found.  Procedures Procedures (including critical care time)  Medications Ordered in UC Medications - No data to display  Initial Impression / Assessment and Plan / UC Course  I have reviewed the triage vital signs and the nursing notes.  Pertinent labs & imaging results that were available during my care of the patient were reviewed by me and considered in my medical decision making (see chart for details).  The patient is well-appearing, he is in no acute distress, he is mildly hypertensive, but vital signs are mostly stable.  COVID test is pending.  Patient is a candidate to receive Paxlovid if the test is positive.  For patient's underlying history of seasonal allergies, cetirizine 10 mg and fluticasone 50 mcg nasal spray was prescribed.  Supportive care recommendations were provided and discussed with the patient to include increasing fluids, allowing for plenty of rest, and over-the-counter analgesics for pain or discomfort.  Discussed and provided current CDC isolation guidelines.  Patient was given ER follow-up precautions.  Patient was in agreement with this plan of care and verbalizes understanding.  All questions were answered.  Patient stable for discharge.  Work note was provided.  Final Clinical Impressions(s) / UC Diagnoses   Final diagnoses:  Exposure to COVID-19 virus  History of allergic rhinitis     Discharge Instructions      COVID test is pending.  As discussed, you will be contacted if the pending test is positive to provide treatment. Take medication  as prescribed. Increase fluids and allow for plenty of rest. Recommend normal saline nasal spray throughout the day to help with nasal congestion and runny nose. If your COVID test is positive, you will take the medication for 5 days.  Make sure you are wearing your mask during  that time.  If you complete the medication and continue to experience symptoms, you will need to continue to wear your mask for an additional 5 days.  If you develop a fever, you will need to remain home isolated until you have been fever free for 24 hours with no medication. Go to the emergency department if you experience shortness of breath, difficulty breathing, or other concerns. Follow-up as needed.     ED Prescriptions     Medication Sig Dispense Auth. Provider   cetirizine (ZYRTEC) 10 MG tablet Take 1 tablet (10 mg total) by mouth daily. 30 tablet Skylor Hughson-Warren, Sadie Haber, NP   fluticasone (FLONASE) 50 MCG/ACT nasal spray Place 2 sprays into both nostrils daily. 16 g Seve Monette-Warren, Sadie Haber, NP      PDMP not reviewed this encounter.   Abran Cantor, NP 07/07/23 1710

## 2023-07-08 LAB — SARS CORONAVIRUS 2 (TAT 6-24 HRS): SARS Coronavirus 2: NEGATIVE

## 2023-07-12 ENCOUNTER — Ambulatory Visit
Admission: EM | Admit: 2023-07-12 | Discharge: 2023-07-12 | Disposition: A | Discharge status: Home / Self Care | Payer: BC Managed Care – PPO | Attending: Nurse Practitioner | Admitting: Nurse Practitioner

## 2023-07-12 DIAGNOSIS — Z202 Contact with and (suspected) exposure to infections with a predominantly sexual mode of transmission: Secondary | ICD-10-CM | POA: Diagnosis not present

## 2023-07-12 DIAGNOSIS — Z113 Encounter for screening for infections with a predominantly sexual mode of transmission: Secondary | ICD-10-CM | POA: Insufficient documentation

## 2023-07-12 MED ORDER — AZITHROMYCIN 500 MG PO TABS: 1000.0000 mg | ORAL_TABLET | Freq: Once | ORAL | 0 refills | Status: AC

## 2023-07-12 NOTE — ED Provider Notes (Signed)
[facility]    CSN: [id number] Arrival date & time: 07/12/23  1050      History   Chief Complaint No chief complaint on file.   HPI Javier Moss is a 37 y.o. male.   The history is provided by the patient.   The patient presents for STI testing.  Patient states he was told by previous partner to get checked for chlamydia.  Patient denies symptoms to include penile discharge, abdominal pain, scrotal/testicular pain/swelling, urinary frequency, urgency, hesitancy, hematuria, or decreased urine stream.  The patient reports 3 male partners in the past 90 days.  Patient denies prior history of STI or STD.  History reviewed. No pertinent past medical history.  There are no problems to display for this patient.   History reviewed. No pertinent surgical history.     Home Medications    Prior to Admission medications   Medication Sig Start Date End Date Taking? Authorizing Provider  cetirizine (ZYRTEC) 10 MG tablet Take 1 tablet (10 mg total) by mouth daily. 07/07/23   Merlin Ege-Warren, Sadie Haber, NP  fluticasone (FLONASE) 50 MCG/ACT nasal spray Place 2 sprays into both nostrils daily. 07/07/23   Ha Placeres-Warren, Sadie Haber, NP  ibuprofen (ADVIL) 600 MG tablet Take 1 tablet (600 mg total) by mouth every 6 (six) hours as needed. 08/31/22   Jacalyn Lefevre, MD    Family History History reviewed. No pertinent family history.  Social History Social History   Tobacco Use   Smoking status: Former   Smokeless tobacco: Never  Advertising account planner   Vaping status: Never Used  Substance Use Topics   Alcohol use: No   Drug use: Not Currently    Types: Marijuana    Comment: 2 days ago      Allergies   Patient has no known allergies.   Review of Systems Review of Systems Per HPI  Physical Exam Triage Vital Signs ED Triage Vitals  Encounter Vitals Group     BP 07/12/23 1052 (!) 148/95     Systolic BP Percentile --      Diastolic BP Percentile --      Pulse Rate  07/12/23 1052 69     Resp 07/12/23 1052 15     Temp 07/12/23 1052 98 F (36.7 C)     Temp Source 07/12/23 1052 Oral     SpO2 07/12/23 1052 97 %     Weight --      Height --      Head Circumference --      Peak Flow --      Pain Score 07/12/23 1058 0     Pain Loc --      Pain Education --      Exclude from Growth Chart --    No data found.  Updated Vital Signs BP (!) 148/95 (BP Location: Right Arm)   Pulse 69   Temp 98 F (36.7 C) (Oral)   Resp 15   SpO2 97%   Visual Acuity Right Eye Distance:   Left Eye Distance:   Bilateral Distance:    Right Eye Near:   Left Eye Near:    Bilateral Near:     Physical Exam Vitals and nursing note reviewed.  Constitutional:      General: He is not in acute distress.    Appearance: Normal appearance.  Eyes:     Extraocular Movements: Extraocular movements intact.     Pupils: Pupils are equal, round, and reactive to light.  Cardiovascular:  Rate and Rhythm: Normal rate and regular rhythm.     Pulses: Normal pulses.     Heart sounds: Normal heart sounds.  Pulmonary:     Effort: Pulmonary effort is normal.     Breath sounds: Normal breath sounds.  Abdominal:     General: Bowel sounds are normal.     Palpations: Abdomen is soft.  Musculoskeletal:     Cervical back: Normal range of motion.  Lymphadenopathy:     Cervical: No cervical adenopathy.  Skin:    General: Skin is warm and dry.  Neurological:     General: No focal deficit present.     Mental Status: He is alert and oriented to person, place, and time.  Psychiatric:        Mood and Affect: Mood normal.        Behavior: Behavior normal.      UC Treatments / Results  Labs (all labs ordered are listed, but only abnormal results are displayed) Labs Reviewed  HIV ANTIBODY (ROUTINE TESTING W REFLEX)  RPR  CYTOLOGY, (ORAL, ANAL, URETHRAL) ANCILLARY ONLY  CYTOLOGY, (ORAL, ANAL, URETHRAL) ANCILLARY ONLY    EKG   Radiology No results  found.  Procedures Procedures (including critical care time)  Medications Ordered in UC Medications - No data to display  Initial Impression / Assessment and Plan / UC Course  I have reviewed the triage vital signs and the nursing notes.  Pertinent labs & imaging results that were available during my care of the patient were reviewed by me and considered in my medical decision making (see chart for details).  The patient is well-appearing, he is in no acute distress, vital signs are stable.  Patient presents for STI testing after exposure to chlamydia.  Patient denies symptoms at this time.  Cytology swabs and RPR/HIV test are pending.  Will treat patient prophylactically with azithromycin 1 g.  Patient advised to refrain from sexual activity for the next 7 days.  Patient advised results will be available using his MyChart account.  Supportive care recommendations were provided to the patient to include condom use with each sexual encounter.  Patient is in agreement with this plan of care and verbalizes understanding.  All questions were answered.  Patient stable for discharge.   Final Clinical Impressions(s) / UC Diagnoses   Final diagnoses:  Screening examination for sexually transmitted disease  Exposure to chlamydia     Discharge Instructions      Take medication as prescribed. Refrain from sexual intercourse for the next 7 days. Your cytology swab results and HIV/syphilis results will be available within the next 48 to 72 hours.  You also have access to the results via MyChart.  If your results are abnormal, and you have not been contacted, you can contact this office. Condom use with each sexual encounter. Follow-up as needed.     ED Prescriptions   None    PDMP not reviewed this encounter.   Abran Cantor, NP 07/12/23 1111
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
## 2023-07-12 NOTE — Discharge Instructions (Addendum)
Take medication as prescribed. Refrain from sexual intercourse for the next 7 days. Your cytology swab results and HIV/syphilis results will be available within the next 48 to 72 hours.  You also have access to the results via MyChart.  If your results are abnormal, and you have not been contacted, you can contact this office. Condom use with each sexual encounter. Follow-up as needed.

## 2023-07-13 LAB — RPR: RPR Ser Ql: NONREACTIVE

## 2023-07-13 LAB — CYTOLOGY, (ORAL, ANAL, URETHRAL) ANCILLARY ONLY
Chlamydia: POSITIVE — AB
Comment: NEGATIVE
Comment: NEGATIVE
Comment: NORMAL
Neisseria Gonorrhea: NEGATIVE
Trichomonas: NEGATIVE

## 2023-07-13 LAB — HIV ANTIBODY (ROUTINE TESTING W REFLEX): HIV Screen 4th Generation wRfx: NONREACTIVE

## 2023-07-14 LAB — CYTOLOGY, (ORAL, ANAL, URETHRAL) ANCILLARY ONLY
Chlamydia: NEGATIVE
Comment: NEGATIVE
Comment: NEGATIVE
Comment: NORMAL
Neisseria Gonorrhea: NEGATIVE
Trichomonas: NEGATIVE

## 2024-02-01 ENCOUNTER — Emergency Department (HOSPITAL_COMMUNITY)
Admission: EM | Admit: 2024-02-01 | Discharge: 2024-02-01 | Disposition: A | Discharge status: Home / Self Care | Payer: BC Managed Care – PPO | Attending: Emergency Medicine | Admitting: Emergency Medicine

## 2024-02-01 ENCOUNTER — Emergency Department (HOSPITAL_COMMUNITY): Payer: BC Managed Care – PPO

## 2024-02-01 ENCOUNTER — Other Ambulatory Visit: Payer: Self-pay

## 2024-02-01 ENCOUNTER — Encounter (HOSPITAL_COMMUNITY): Payer: Self-pay

## 2024-02-01 DIAGNOSIS — M25511 Pain in right shoulder: Secondary | ICD-10-CM | POA: Diagnosis not present

## 2024-02-01 DIAGNOSIS — M545 Low back pain, unspecified: Secondary | ICD-10-CM | POA: Diagnosis present

## 2024-02-01 DIAGNOSIS — Y92410 Unspecified street and highway as the place of occurrence of the external cause: Secondary | ICD-10-CM | POA: Insufficient documentation

## 2024-02-01 MED ORDER — IBUPROFEN 800 MG PO TABS
800.0000 mg | ORAL_TABLET | Freq: Once | ORAL | Status: AC
  Administered 2024-02-01: 800 mg via ORAL
  Filled 2024-02-01: qty 1

## 2024-02-01 MED ORDER — CYCLOBENZAPRINE HCL 10 MG PO TABS: 10.0000 mg | ORAL_TABLET | Freq: Two times a day (BID) | ORAL | 0 refills | Status: AC | PRN

## 2024-02-01 MED ORDER — IBUPROFEN 600 MG PO TABS: 600.0000 mg | ORAL_TABLET | Freq: Four times a day (QID) | ORAL | 0 refills | Status: AC | PRN
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
